# Patient Record
Sex: Male | Born: 1990 | Race: Black or African American | Hispanic: No | Smoking: Never smoker
Health system: Southern US, Community
[De-identification: ages and names within clinical notes are randomized; demographics above are authoritative.]

---

## 2014-07-30 ENCOUNTER — Encounter: Payer: Self-pay | Admitting: Emergency Medicine

## 2014-07-30 ENCOUNTER — Other Ambulatory Visit: Payer: Self-pay

## 2014-07-30 ENCOUNTER — Emergency Department: Payer: Self-pay

## 2014-07-30 ENCOUNTER — Emergency Department
Admission: EM | Admit: 2014-07-30 | Discharge: 2014-07-30 | Disposition: A | Discharge status: Home / Self Care | Payer: Self-pay | Attending: Emergency Medicine | Admitting: Emergency Medicine

## 2014-07-30 DIAGNOSIS — W01198A Fall on same level from slipping, tripping and stumbling with subsequent striking against other object, initial encounter: Secondary | ICD-10-CM | POA: Insufficient documentation

## 2014-07-30 DIAGNOSIS — W92XXXA Exposure to excessive heat of man-made origin, initial encounter: Secondary | ICD-10-CM | POA: Insufficient documentation

## 2014-07-30 DIAGNOSIS — T679XXA Effect of heat and light, unspecified, initial encounter: Secondary | ICD-10-CM | POA: Insufficient documentation

## 2014-07-30 DIAGNOSIS — Y9389 Activity, other specified: Secondary | ICD-10-CM | POA: Insufficient documentation

## 2014-07-30 DIAGNOSIS — S0990XA Unspecified injury of head, initial encounter: Secondary | ICD-10-CM | POA: Insufficient documentation

## 2014-07-30 DIAGNOSIS — Y9289 Other specified places as the place of occurrence of the external cause: Secondary | ICD-10-CM | POA: Insufficient documentation

## 2014-07-30 DIAGNOSIS — Y990 Civilian activity done for income or pay: Secondary | ICD-10-CM | POA: Insufficient documentation

## 2014-07-30 DIAGNOSIS — R55 Syncope and collapse: Secondary | ICD-10-CM | POA: Insufficient documentation

## 2014-07-30 LAB — CBC
HEMATOCRIT: 46.6 % (ref 40.0–52.0)
Hemoglobin: 16.1 g/dL (ref 13.0–18.0)
MCH: 30.5 pg (ref 26.0–34.0)
MCHC: 34.6 g/dL (ref 32.0–36.0)
MCV: 88.2 fL (ref 80.0–100.0)
PLATELETS: 305 10*3/uL (ref 150–440)
RBC: 5.28 MIL/uL (ref 4.40–5.90)
RDW: 14.9 % — ABNORMAL HIGH (ref 11.5–14.5)
WBC: 8.1 10*3/uL (ref 3.8–10.6)

## 2014-07-30 LAB — BASIC METABOLIC PANEL
Anion gap: 10 (ref 5–15)
BUN: 14 mg/dL (ref 6–20)
CO2: 22 mmol/L (ref 22–32)
Calcium: 9.2 mg/dL (ref 8.9–10.3)
Chloride: 104 mmol/L (ref 101–111)
Creatinine, Ser: 1.04 mg/dL (ref 0.61–1.24)
GFR calc non Af Amer: >60 mL/min (ref >60)
Glucose, Bld: 85 mg/dL (ref 65–99)
POTASSIUM: 3.5 mmol/L (ref 3.5–5.1)
SODIUM: 136 mmol/L (ref 135–145)

## 2014-07-30 MED ORDER — SODIUM CHLORIDE 0.9 % IV SOLN
Freq: Once | INTRAVENOUS | Status: AC
  Administered 2014-07-30: 16:00:00 via INTRAVENOUS

## 2014-07-30 NOTE — Discharge Instructions (Signed)
Head Injury °You have received a head injury. It does not appear serious at this time. Headaches and vomiting are common following head injury. It should be easy to awaken from sleeping. Sometimes it is necessary for you to stay in the emergency department for a while for observation. Sometimes admission to the hospital may be needed. After injuries such as yours, most problems occur within the first 24 hours, but side effects may occur up to 7-10 days after the injury. It is important for you to carefully monitor your condition and contact your health care provider or seek immediate medical care if there is a change in your condition. °WHAT ARE THE TYPES OF HEAD INJURIES? °Head injuries can be as minor as a bump. Some head injuries can be more severe. More severe head injuries include: °· A jarring injury to the brain (concussion). °· A bruise of the brain (contusion). This mean there is bleeding in the brain that can cause swelling. °· A cracked skull (skull fracture). °· Bleeding in the brain that collects, clots, and forms a bump (hematoma). °WHAT CAUSES A HEAD INJURY? °A serious head injury is most likely to happen to someone who is in a car wreck and is not wearing a seat belt. Other causes of major head injuries include bicycle or motorcycle accidents, sports injuries, and falls. °HOW ARE HEAD INJURIES DIAGNOSED? °A complete history of the event leading to the injury and your current symptoms will be helpful in diagnosing head injuries. Many times, pictures of the brain, such as CT or MRI are needed to see the extent of the injury. Often, an overnight hospital stay is necessary for observation.  °WHEN SHOULD I SEEK IMMEDIATE MEDICAL CARE?  °You should get help right away if: °· You have confusion or drowsiness. °· You feel sick to your stomach (nauseous) or have continued, forceful vomiting. °· You have dizziness or unsteadiness that is getting worse. °· You have severe, continued headaches not relieved by  medicine. Only take over-the-counter or prescription medicines for pain, fever, or discomfort as directed by your health care provider. °· You do not have normal function of the arms or legs or are unable to walk. °· You notice changes in the black spots in the center of the colored part of your eye (pupil). °· You have a clear or bloody fluid coming from your nose or ears. °· You have a loss of vision. °During the next 24 hours after the injury, you must stay with someone who can watch you for the warning signs. This person should contact local emergency services (911 in the [place] if you have seizures, you become unconscious, or you are unable to wake up. °HOW CAN I PREVENT A HEAD INJURY IN THE FUTURE? °The most important factor for preventing major head injuries is avoiding motor vehicle accidents.  To minimize the potential for damage to your head, it is crucial to wear seat belts while riding in motor vehicles. Wearing helmets while bike riding and playing collision sports (like football) is also helpful. Also, avoiding dangerous activities around the house will further help reduce your risk of head injury.  °WHEN CAN I RETURN TO NORMAL ACTIVITIES AND ATHLETICS? °You should be reevaluated by your health care provider before returning to these activities. If you have any of the following symptoms, you should not return to activities or contact sports until 1 week after the symptoms have stopped: °· Persistent headache. °· Dizziness or vertigo. °· Poor attention and concentration. °· Confusion. °·   Memory problems.  Nausea or vomiting.  Fatigue or tire easily.  Irritability.  Intolerant of bright lights or loud noises.  Anxiety or depression.  Disturbed sleep. MAKE SURE YOU:   Understand these instructions.  Will watch your condition.  Will get help right away if you are not doing well or get worse. Document Released: 12/21/2004 Document Revised: 12/26/2012 Document Reviewed:  08/28/2012 [facility] Patient Information 2015 [place], [place]. This information is not intended to replace advice given to you by your health care provider. Make sure you discuss any questions you have with your health care provider.  Syncope Syncope means a person passes out (faints). The person usually wakes up in less than 5 minutes. It is important to seek medical care for syncope. HOME CARE  Have someone stay with you until you feel normal.  Do not drive, use machines, or play sports until your doctor says it is okay.  Keep all doctor visits as told.  Lie down when you feel like you might pass out. Take deep breaths. Wait until you feel normal before standing up.  Drink enough fluids to keep your pee (urine) clear or pale yellow.  If you take blood pressure or heart medicine, get up slowly. Take several minutes to sit and then stand. GET HELP RIGHT AWAY IF:   You have a severe headache.  You have pain in the chest, belly (abdomen), or back.  You are bleeding from the mouth or butt (rectum).  You have black or tarry poop (stool).  You have an irregular or very fast heartbeat.  You have pain with breathing.  You keep passing out, or you have shaking (seizures) when you pass out.  You pass out when sitting or lying down.  You feel confused.  You have trouble walking.  You have severe weakness.  You have vision problems. If you fainted, call your local emergency services (911 in [place] Do not drive yourself to the hospital. MAKE SURE YOU:   Understand these instructions.  Will watch your condition.  Will get help right away if you are not doing well or get worse. Document Released: 06/09/2007 Document Revised: 06/22/2011 Document Reviewed: 02/19/2011 [facility] Patient Information 2015 [place], [place]. This information is not intended to replace advice given to you by your health care provider. Make sure you discuss any questions you have with your health care  provider.  Heat-Related Illness Heat-related illnesses occur when the body is unable to properly cool itself. The body normally cools itself by sweating. However, under some conditions sweating is not enough. In these cases, a person's body temperature rises rapidly. Very high body temperatures may damage the brain or other vital organs. Some examples of heat-related illnesses include:  Heat stroke. This occurs when the body is unable to regulate its temperature. The body's temperature rises rapidly, the sweating mechanism fails, and the body is unable to cool down. Body temperature may rise to 106 F (41 C) or higher within 10 to 15 minutes. Heat stroke can cause death or permanent disability if emergency treatment is not provided.  Heat exhaustion. This is a milder form of heat-related illness that can develop after several days of exposure to high temperatures and not enough fluids. It is the body's response to an excessive loss of the water and salt contained in sweat.  Heat cramps. These usually affect people who sweat a lot during heavy activity. This sweating drains the body's salt and moisture. The low salt level in the muscles causes painful  cramps. Heat cramps may also be a symptom of heat exhaustion. Heat cramps usually occur in the abdomen, arms, or legs. Get medical attention for cramps if you have heart problems or are on a low-sodium diet. Those that are at greatest risk for heat-related illnesses include:   The elderly.  Infant and the very young.  People with mental illness and chronic diseases.  People who are overweight (obese).  Young and healthy people can even succumb to heat if they participate in strenuous physical activities during hot weather. CAUSES  Several factors affect the body's ability to cool itself during extremely hot weather. When the humidity is high, sweat will not evaporate as quickly. This prevents the body from releasing heat quickly. Other factors  that can affect the body's ability to cool down include:   Age.  Obesity.  Fever.  Dehydration.  Heart disease.  Mental illness.  Poor circulation.  Sunburn.  Prescription drug use.  Alcohol use. SYMPTOMS  Heat stroke: Warning signs of heat stroke vary, but may include:  An extremely high body temperature (above 103F orally).  A fast, strong pulse.  Dizziness.  Confusion.  Red, hot, and dry skin.  No sweating.  Throbbing headache.  Feeling sick to your stomach (nauseous).  Unconsciousness. Heat exhaustion: Warning signs of heat exhaustion include:  Heavy sweating.  Tiredness.  Headache.  Paleness.  Weakness.  Feeling sick to your stomach (nauseous) or vomiting.  Muscle cramps. Heat cramps  Muscle pains or spasms. TREATMENT  Heat stroke  Get into a cool environment. An indoor place that is air-conditioned may be best.  Take a cool shower or bath. Have someone around to make sure you are okay.  Take your temperature. Make sure it is going down. Heat exhaustion  Drink plenty of fluids. Do not drink liquids that contain caffeine, alcohol, or large amounts of sugar. These cause you to lose more body fluid. Also, avoid very cold drinks. They can cause stomach cramps.  Get into a cool environment. An indoor place that is air-conditioned may be best.  Take a cool shower or bath. Have someone around to make sure you are okay.  Put on lightweight clothing. Heat cramps  Stop whatever activity you were doing. Do not attempt to do that activity for at least 3 hours after the cramps have gone away.  Get into a cool environment. An indoor place that is air-conditioned may be best. HOME CARE INSTRUCTIONS  To protect your health when temperatures are extremely high, follow these tips:  During heavy exercise in a hot environment, drink two to four glasses (16-32 ounces) of cool fluids each hour. Do not wait until you are thirsty to drink. Warning: If  your caregiver limits the amount of fluid you drink or has you on water pills, ask how much you should drink while the weather is hot.  Do not drink liquids that contain caffeine, alcohol, or large amounts of sugar. These cause you to lose more body fluid.  Avoid very cold drinks. They can cause stomach cramps.  Wear appropriate clothing. Choose lightweight, light-colored, loose-fitting clothing.  If you must be outdoors, try to limit your outdoor activity to morning and evening hours. Try to rest often in shady areas.  If you are not used to working or exercising in a hot environment, start slowly and pick up the pace gradually.  Stay cool in an air-conditioned place if possible. If your home does not have air conditioning, go to the shopping mall or  public [employer].  Taking a cool shower or bath may help you cool off. SEEK MEDICAL CARE IF:   You see any of the symptoms listed above. You may be dealing with a life-threatening emergency.  Symptoms worsen or last longer than 1 hour.  Heat cramps do not get better in 1 hour. MAKE SURE YOU:   Understand these instructions.  Will watch your condition.  Will get help right away if you are not doing well or get worse. Document Released: 09/30/2007 Document Revised: 03/15/2011 Document Reviewed: 09/30/2007 [facility] Patient Information 2015 [place], [place]. This information is not intended to replace advice given to you by your health care provider. Make sure you discuss any questions you have with your health care provider.

## 2014-07-30 NOTE — ED Provider Notes (Signed)
[facility] Emergency Department Provider Note     Time seen: ----------------------------------------- 3:16 PM on 07/30/2014 -----------------------------------------    I have reviewed the triage vital signs and the nursing notes.   HISTORY  Chief Complaint Loss of Consciousness    HPI [place] Meyers is a 24 y.o. male who presents ER after having a syncopal episode while at work. Patient states he fell back and hit his head and still complains of tingling sensation in the area where he hit his head. Patient states he was drinking plain water but has not eaten anything yet today. Blood sugar was 75, he has not had this happen to him before.Patient  still has not eaten.   History reviewed. No pertinent past medical history.  There are no active problems to display for this patient.   History reviewed. No pertinent past surgical history.  Allergies Melatonin and Tylenol  Social History History  Substance Use Topics  . Smoking status: Never Smoker   . Smokeless tobacco: Not on file  . Alcohol Use: No    Review of Systems Constitutional: Negative for fever. Eyes: Negative for visual changes. ENT: Negative for sore throat. Cardiovascular: Negative for chest pain. Respiratory: Negative for shortness of breath. Gastrointestinal: Negative for abdominal pain, vomiting and diarrhea. Genitourinary: Negative for dysuria. Musculoskeletal: Negative for back pain. Skin: Negative for rash. Neurological: Positive for mild headache  10-point ROS otherwise negative.  ____________________________________________   PHYSICAL EXAM:  VITAL SIGNS: ED Triage Vitals  Enc Vitals Group     BP 07/30/14 1401 120/75 mmHg     Pulse Rate 07/30/14 1401 75     Resp 07/30/14 1401 16     Temp 07/30/14 1401 98.1 F (36.7 C)     Temp Source 07/30/14 1401 Oral     SpO2 07/30/14 1401 97 %     Weight 07/30/14 1401 240 lb (108.863 kg)     Height 07/30/14 1401   (1.727 m)     Head Cir --      Peak Flow --      Pain Score --      Pain Loc --      Pain Edu? --      Excl. in GC? --     Constitutional: Alert and oriented. Well appearing and in no distress. Eyes: Conjunctivae are normal. PERRL. Normal extraocular movements. ENT   Head: Small posterior scalp hematoma   Nose: No congestion/rhinnorhea.   Mouth/Throat: Mucous membranes are moist.   Neck: No stridor. Cardiovascular: Normal rate, regular rhythm. Normal and symmetric distal pulses are present in all extremities. No murmurs, rubs, or gallops. Respiratory: Normal respiratory effort without tachypnea nor retractions. Breath sounds are clear and equal bilaterally. No wheezes/rales/rhonchi. Gastrointestinal: Soft and nontender. No distention. No abdominal bruits. There is no CVA tenderness. Musculoskeletal: Nontender with normal range of motion in all extremities. No joint effusions.  No lower extremity tenderness nor edema. Neurologic:  Normal speech and language. No gross focal neurologic deficits are appreciated. Speech is normal. No gait instability. Skin:  Skin is warm, dry and intact. No rash noted. Psychiatric: Mood and affect are normal. Speech and behavior are normal. Patient exhibits appropriate insight and judgment. ____________________________________________  EKG: Interpreted by me. Normal sinus rhythm with normal axis normal intervals. No evidence of hypertrophy or acute infarction. Rate is 76 bpm  ____________________________________________  ED COURSE:  Pertinent labs & imaging results that were available during my care of the patient were reviewed by me and  considered in my medical decision making (see chart for details).  ____________________________________________    LABS (pertinent positives/negatives)  Labs Reviewed  CBC - Abnormal; Notable for the following:    RDW 14.9 (*)    All other components within normal limits  BASIC METABOLIC PANEL  CBG  MONITORING, ED    RADIOLOGY  CT head is unremarkable  ____________________________________________  FINAL ASSESSMENT AND PLAN  Syncope, heat related illness, minor head injury  Plan: Patient with labs and imaging as dictated above. Patient has received fluids here, feels better and has eaten. He is stable for outpatient follow-up.   Emily Filbert, MD   Emily Filbert, MD 07/30/14 [phone]

## 2014-07-30 NOTE — ED Notes (Signed)
Pt comes in via EMS due to a syncopal episode while at work.  Patient fell back and hit his head and still complains of a tingling sensation in the area where hit head was hit.  Patient explains he has been drinking plenty of water but has not eaten anything yet today.  CBG 75, temp 98.7.  C-spine cleared in the field, patient is a&Ox4.

## 2016-04-04 IMAGING — CT CT HEAD W/O CM
2 series · 14 of 30 positions shown, 16 images · non-contrast
Comparison: None.

CLINICAL DATA: Syncope today with a fall and blow to the back of
the head.

EXAM:
CT HEAD WITHOUT CONTRAST
TECHNIQUE: Contiguous axial images were obtained from the base of the skull
through the vertex without intravenous contrast.

[Series 2: head wo · axial · 0.41mm/px · z∈[+600,+712]mm · 6 of 36 slices shown, 8 images]
[im 6/36  brain]
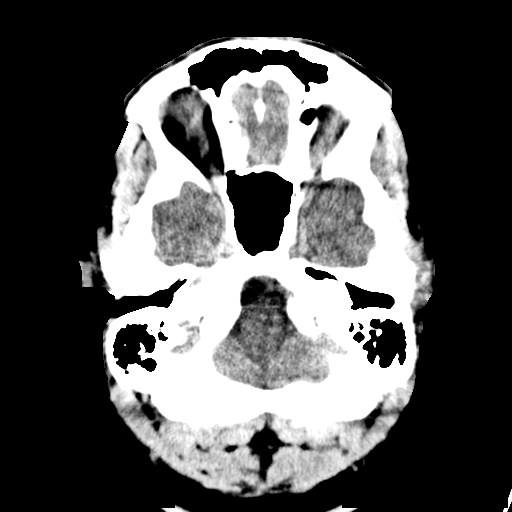
[im 6/36  bone]
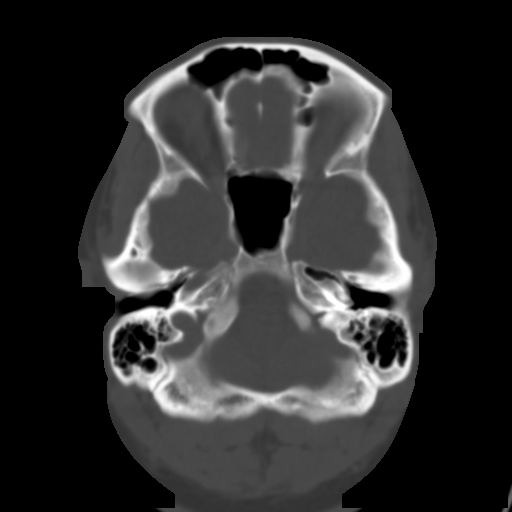
[im 11/36  brain]
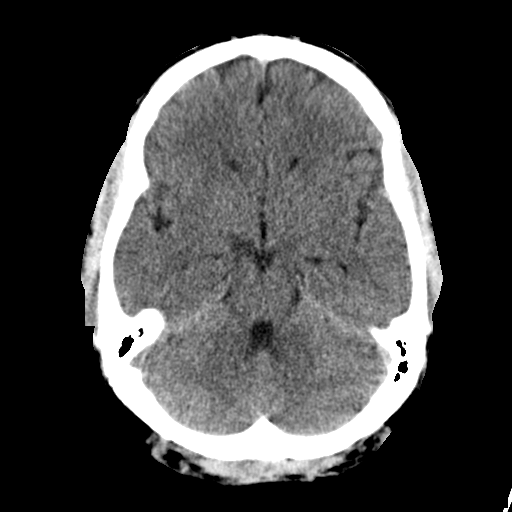
[im 16/36  brain]
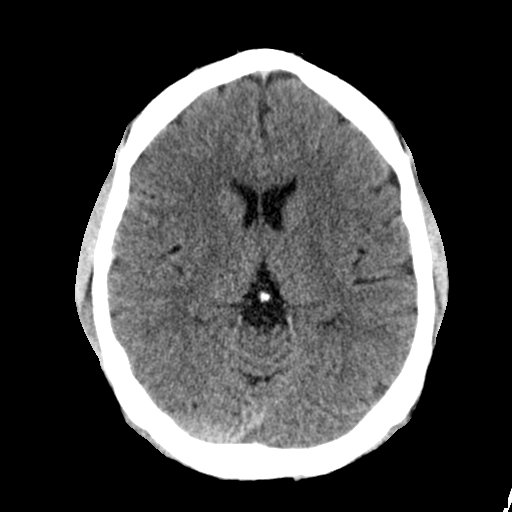
[im 21/36  brain]
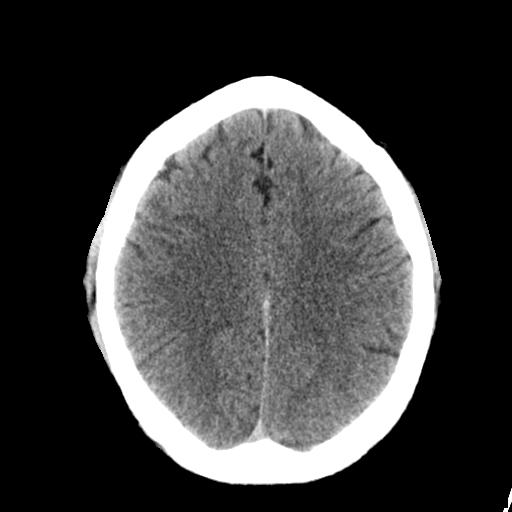
[im 26/36  brain]
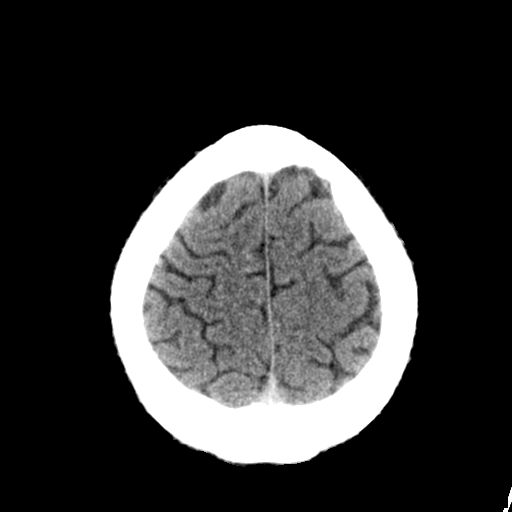
[im 26/36  bone]
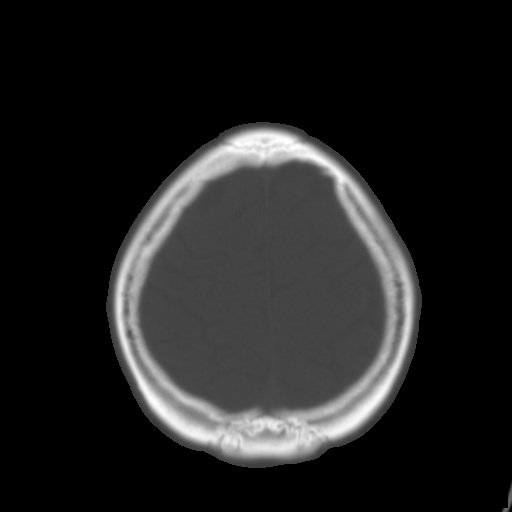
[im 31/36  brain]
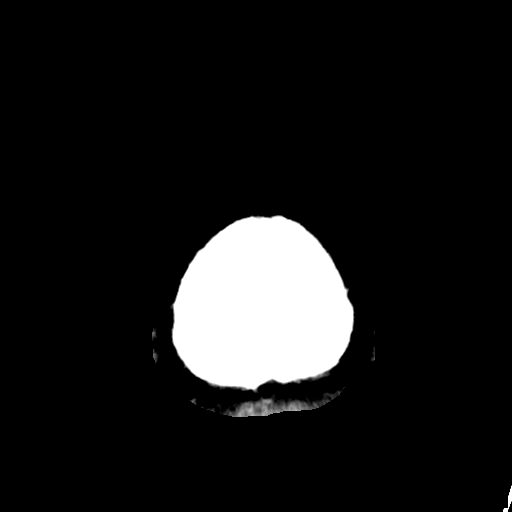

[Series 3: head bone · axial · 0.41mm/px · z∈[+591,+720]mm · 8 of 108 slices shown]
[im 11/108  bone]
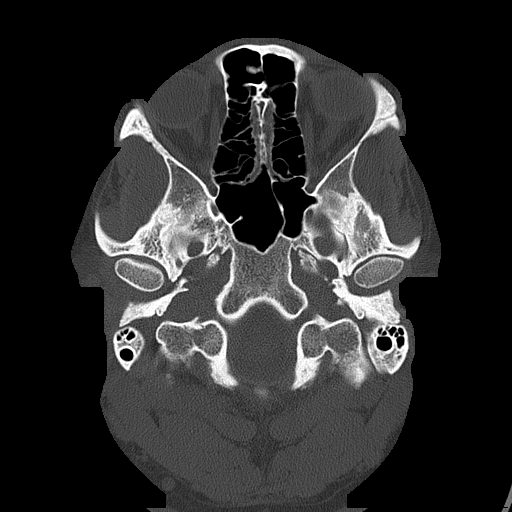
[im 21/108  bone]
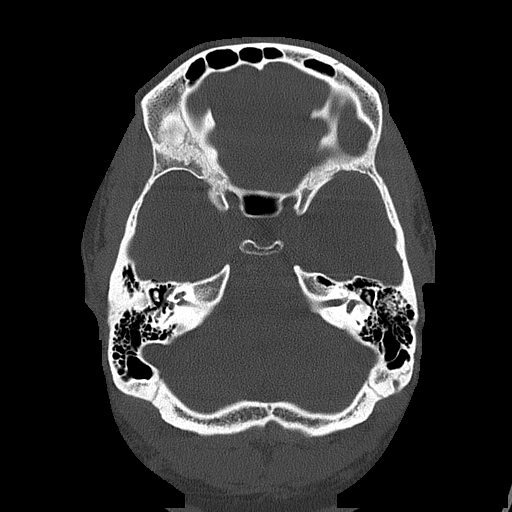
[im 36/108  bone]
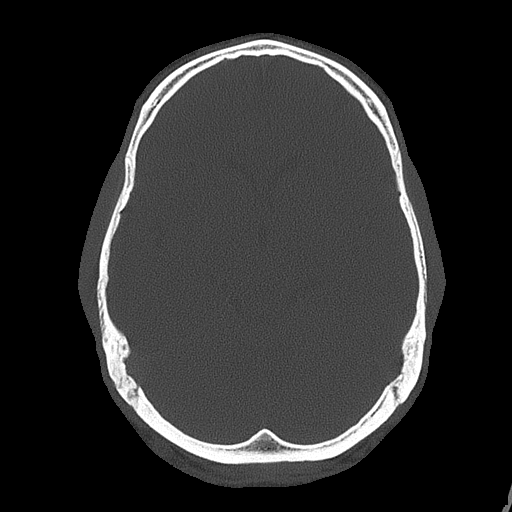
[im 46/108  bone]
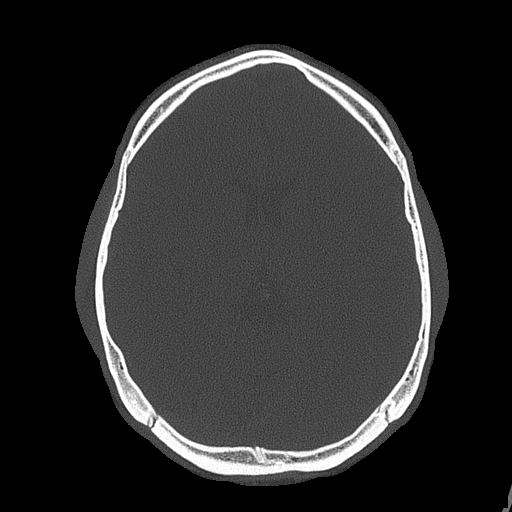
[im 62/108  bone]
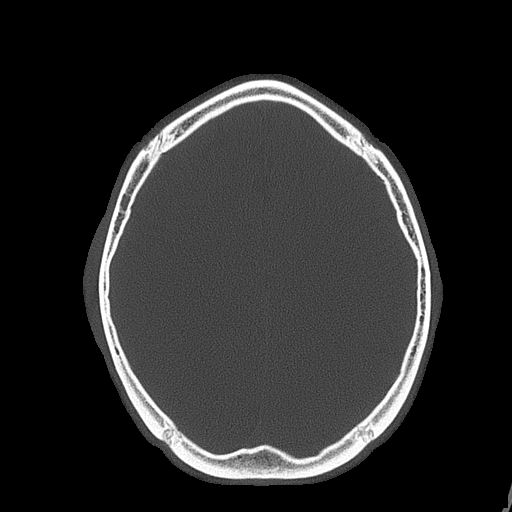
[im 72/108  bone]
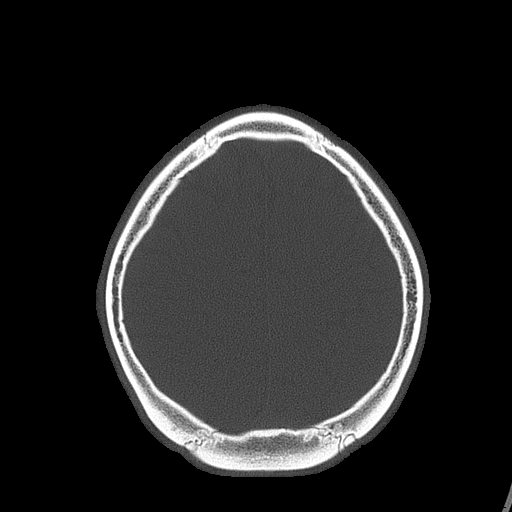
[im 87/108  bone]
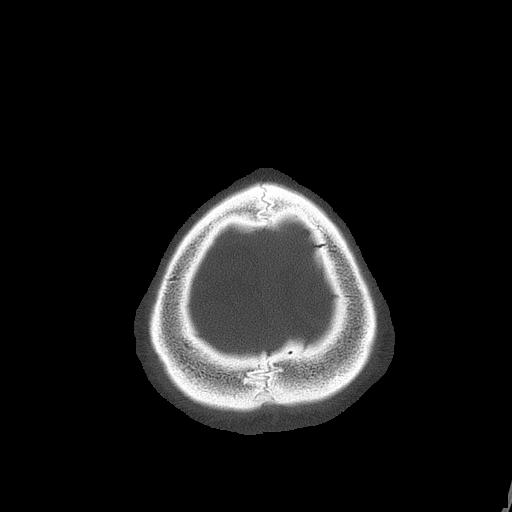
[im 97/108  bone]
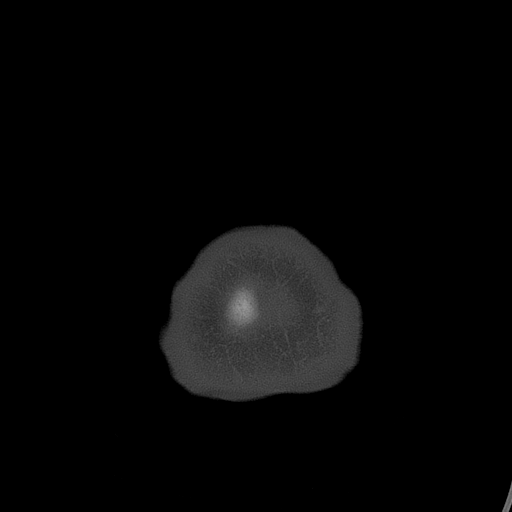

[14 of 30 positions shown; findings below may reference images not displayed]

FINDINGS: The brain appears normal without hemorrhage, infarct, mass lesion,
mass effect, midline shift or abnormal extra-axial fluid collection.
There is no hydrocephalus or pneumocephalus. The calvarium is
intact. Imaged paranasal sinuses and mastoid air cells are clear.
IMPRESSION: Normal head CT.
